# Patient Record
Sex: Male | Born: 1985 | Race: Black or African American | Hispanic: No | Marital: Single | Smoking: Current every day smoker
Health system: Southern US, Community
[De-identification: ages and names within clinical notes are randomized; demographics above are authoritative.]

---

## 2007-03-07 ENCOUNTER — Emergency Department: Payer: Self-pay | Admitting: Emergency Medicine

## 2010-02-10 ENCOUNTER — Emergency Department: Payer: Self-pay | Admitting: Emergency Medicine

## 2012-01-24 ENCOUNTER — Emergency Department: Payer: Self-pay | Admitting: Emergency Medicine

## 2012-08-11 ENCOUNTER — Emergency Department: Payer: Self-pay | Admitting: Internal Medicine

## 2013-07-10 ENCOUNTER — Emergency Department (HOSPITAL_COMMUNITY)
Admission: EM | Admit: 2013-07-10 | Discharge: 2013-07-10 | Disposition: A | Discharge status: Home / Self Care | Payer: Self-pay | Attending: Emergency Medicine | Admitting: Emergency Medicine

## 2013-07-10 ENCOUNTER — Emergency Department (HOSPITAL_COMMUNITY): Payer: Self-pay

## 2013-07-10 ENCOUNTER — Encounter (HOSPITAL_COMMUNITY): Payer: Self-pay | Admitting: Emergency Medicine

## 2013-07-10 DIAGNOSIS — N419 Inflammatory disease of prostate, unspecified: Secondary | ICD-10-CM | POA: Insufficient documentation

## 2013-07-10 DIAGNOSIS — Z87891 Personal history of nicotine dependence: Secondary | ICD-10-CM | POA: Insufficient documentation

## 2013-07-10 LAB — CBC WITH DIFFERENTIAL/PLATELET
BASOS ABS: 0 10*3/uL (ref 0.0–0.1)
Basophils Relative: 0 % (ref 0–1)
Eosinophils Absolute: 0.2 10*3/uL (ref 0.0–0.7)
Eosinophils Relative: 2 % (ref 0–5)
HCT: 40 % (ref 39.0–52.0)
Hemoglobin: 13.8 g/dL (ref 13.0–17.0)
LYMPHS ABS: 2.4 10*3/uL (ref 0.7–4.0)
LYMPHS PCT: 17 % (ref 12–46)
MCH: 32.3 pg (ref 26.0–34.0)
MCHC: 34.5 g/dL (ref 30.0–36.0)
MCV: 93.7 fL (ref 78.0–100.0)
MONO ABS: 0.8 10*3/uL (ref 0.1–1.0)
Monocytes Relative: 6 % (ref 3–12)
NEUTROS ABS: 10.7 10*3/uL — AB (ref 1.7–7.7)
Neutrophils Relative %: 76 % (ref 43–77)
Platelets: 216 10*3/uL (ref 150–400)
RBC: 4.27 MIL/uL (ref 4.22–5.81)
RDW: 13.4 % (ref 11.5–15.5)
WBC: 14.1 10*3/uL — AB (ref 4.0–10.5)

## 2013-07-10 LAB — COMPREHENSIVE METABOLIC PANEL
ALT: 11 U/L (ref 0–53)
AST: 16 U/L (ref 0–37)
Albumin: 4.2 g/dL (ref 3.5–5.2)
Alkaline Phosphatase: 72 U/L (ref 39–117)
BILIRUBIN TOTAL: 0.4 mg/dL (ref 0.3–1.2)
BUN: 10 mg/dL (ref 6–23)
CHLORIDE: 98 meq/L (ref 96–112)
CO2: 27 meq/L (ref 19–32)
Calcium: 10 mg/dL (ref 8.4–10.5)
Creatinine, Ser: 0.9 mg/dL (ref 0.50–1.35)
GLUCOSE: 87 mg/dL (ref 70–99)
Potassium: 4.1 mEq/L (ref 3.7–5.3)
SODIUM: 137 meq/L (ref 137–147)
Total Protein: 7.9 g/dL (ref 6.0–8.3)

## 2013-07-10 LAB — LIPASE, BLOOD: Lipase: 12 U/L (ref 11–59)

## 2013-07-10 LAB — URINALYSIS, ROUTINE W REFLEX MICROSCOPIC
Bilirubin Urine: NEGATIVE
GLUCOSE, UA: NEGATIVE mg/dL
KETONES UR: NEGATIVE mg/dL
Nitrite: NEGATIVE
PROTEIN: 30 mg/dL — AB
Specific Gravity, Urine: 1.016 (ref 1.005–1.030)
Urobilinogen, UA: 1 mg/dL (ref 0.0–1.0)
pH: 6.5 (ref 5.0–8.0)

## 2013-07-10 LAB — URINE MICROSCOPIC-ADD ON

## 2013-07-10 MED ORDER — SODIUM CHLORIDE 0.9 % IV SOLN
1000.0000 mL | INTRAVENOUS | Status: DC
  Administered 2013-07-10: 1000 mL via INTRAVENOUS

## 2013-07-10 MED ORDER — NAPROXEN 500 MG PO TABS: 500.0000 mg | ORAL_TABLET | Freq: Two times a day (BID) | ORAL | Status: DC

## 2013-07-10 MED ORDER — SODIUM CHLORIDE 0.9 % IV SOLN
1000.0000 mL | Freq: Once | INTRAVENOUS | Status: AC
  Administered 2013-07-10: 1000 mL via INTRAVENOUS

## 2013-07-10 MED ORDER — DOXYCYCLINE HYCLATE 100 MG PO CAPS: 100.0000 mg | ORAL_CAPSULE | Freq: Two times a day (BID) | ORAL | Status: AC

## 2013-07-10 MED ORDER — HYDROCODONE-ACETAMINOPHEN 5-325 MG PO TABS: 1.0000 | ORAL_TABLET | ORAL | Status: AC | PRN

## 2013-07-10 MED ORDER — HYDROMORPHONE HCL PF 1 MG/ML IJ SOLN
0.5000 mg | INTRAMUSCULAR | Status: DC | PRN
  Administered 2013-07-10: 0.5 mg via INTRAVENOUS
  Filled 2013-07-10: qty 1

## 2013-07-10 MED ORDER — ONDANSETRON HCL 4 MG/2ML IJ SOLN
4.0000 mg | Freq: Once | INTRAMUSCULAR | Status: AC
  Administered 2013-07-10: 4 mg via INTRAVENOUS
  Filled 2013-07-10: qty 2

## 2013-07-10 MED ORDER — DEXTROSE 5 % IV SOLN
1.0000 g | INTRAVENOUS | Status: DC
  Administered 2013-07-10: 1 g via INTRAVENOUS
  Filled 2013-07-10: qty 10

## 2013-07-10 NOTE — Discharge Instructions (Signed)
Prostatitis The prostate gland is about the size and shape of a walnut. It is located just below your bladder. It produces one of the components of semen, which is made up of sperm and the fluids that help nourish and transport it out from the testicles. Prostatitis is inflammation of the prostate gland.  There are four types of prostatitis:  Acute bacterial prostatitis This is the least common type of prostatitis. It starts quickly and usually is associated with a bladder infection, high fever, and shaking chills. It can occur at any age.  Chronic bacterial prostatitis This is a persistent bacterial infection in the prostate. It usually develops from repeated acute bacterial prostatitis or acute bacterial prostatitis that was not properly treated. It can occur in men of any age but is most common in middle-aged men whose prostate has begun to enlarge. The symptoms are not as severe as those in acute bacterial prostatitis. Discomfort in the part of your body that is in front of your rectum and below your scrotum (perineum), lower abdomen, or in the head of your penis (glans) may represent your primary discomfort.  Chronic prostatitis (nonbacterial) This is the most common type of prostatitis. It is inflammation of the prostate gland that is not caused by a bacterial infection. The cause is unknown and may be associated with a viral infection or autoimmune disorder.  Prostatodynia (pelvic floor disorder) This is associated with increased muscular tone in the pelvis surrounding the prostate. CAUSES The causes of bacterial prostatitis are bacterial infection. The causes of the other types of prostatitis are unknown.  SYMPTOMS  Symptoms can vary depending upon the type of prostatitis that exists. There can also be overlap in symptoms. Possible symptoms for each type of prostatitis are listed below. Acute Bacterial Prostatitis  Painful urination.  Fever or chills.  Muscle or joint pains.  Low  back pain.  Low abdominal pain.  Inability to empty bladder completely. Chronic Bacterial Prostatitis, Chronic Nonbacterial Prostatitis, and Prostatodynia  Sudden urge to urinate.  Frequent urination.  Difficulty starting urine stream.  Weak urine stream.  Discharge from the urethra.  Dribbling after urination.  Rectal pain.  Pain in the testicles, penis, or tip of the penis.  Pain in the perineum.  Problems with sexual function.  Painful ejaculation.  Bloody semen. DIAGNOSIS  In order to diagnose prostatitis, your health care provider will ask about your symptoms. One or more urine samples will be taken and tested (urinalysis). If the urinalysis result is negative for bacteria, your health care provider may use a finger to feel your prostate (digital rectal exam). This exam helps your health care provider determine if your prostate is swollen and tender. It will also produce a specimen of semen that can be analyzed. TREATMENT  Treatment for prostatitis depends on the cause. If a bacterial infection is the cause, it can be treated with antibiotic medicine. In cases of chronic bacterial prostatitis, the use of antibiotics for up to 1 month or 6 weeks may be necessary. Your health care provider may instruct you to take sitz baths to help relieve pain. A sitz bath is a bath of hot water in which your hips and buttocks are under water. This relaxes the pelvic floor muscles and often helps to relieve the pressure on your prostate. HOME CARE INSTRUCTIONS   Take all medicines as directed by your health care provider.  Take sitz baths as directed by your health care provider. SEEK MEDICAL CARE IF:   Your symptoms   get worse, not better.  You have a fever. SEEK IMMEDIATE MEDICAL CARE IF:   You have chills.  You feel nauseous or vomit.  You feel lightheaded or faint.  You are unable to urinate.  You have blood or blood clots in your urine. Document Released: 03/28/2000  Document Revised: 01/19/2013 Document Reviewed: 10/18/2012 [employer] Patient Information 2014 [employer].  

## 2013-07-10 NOTE — ED Notes (Signed)
Patient states that he is having pain to his left lower abominal area and flank area with blood in his urine x 4 days

## 2013-07-10 NOTE — ED Provider Notes (Signed)
CSN: [id number]     Arrival date & time 07/10/13  1506 History   First MD Initiated Contact with Patient 07/10/13 1539     Chief Complaint  Patient presents with  . Flank Pain    HPI Comments: Left sided, gradually started having pain.  Patient is a 28 y.o. male presenting with flank pain. The history is provided by the patient.  Flank Pain The current episode started more than 2 days ago. The problem occurs constantly. The problem has been gradually worsening. Pertinent negatives include no chest pain, no abdominal pain and no shortness of breath. Nothing aggravates the symptoms. Nothing relieves the symptoms. He has tried nothing for the symptoms.  Pt also noticed blood in the urine for the last 4 days.  No fevers.  No vomiting.  No appetite problems.  No constipation.  History reviewed. No pertinent past medical history. History reviewed. No pertinent past surgical history. No family history on file. History  Substance Use Topics  . Smoking status: Former Games developer  . Smokeless tobacco: Not on file  . Alcohol Use: No    Review of Systems  Respiratory: Negative for shortness of breath.   Cardiovascular: Negative for chest pain.  Gastrointestinal: Negative for abdominal pain.  Genitourinary: Positive for flank pain.  All other systems reviewed and are negative.      Allergies  Review of patient's allergies indicates no known allergies.  Home Medications   Current Outpatient Rx  Name  Route  Sig  Dispense  Refill  . acetaminophen (TYLENOL) 500 MG tablet   Oral   Take 500 mg by mouth every 6 (six) hours as needed for moderate pain.         Marland Kitchen aspirin-acetaminophen-caffeine (EXCEDRIN MIGRAINE) 250-250-65 MG per tablet   Oral   Take 1 tablet by mouth every 6 (six) hours as needed for headache.          BP 116/70  Pulse 96  Temp(Src) 99.2 F (37.3 C) (Oral)  Resp 14  Ht 6' (1.829 m)  Wt 183 lb (83.008 kg)  BMI 24.81 kg/m2  SpO2 99% Physical Exam  Nursing note  and vitals reviewed. Constitutional: He appears well-developed and well-nourished. No distress.  HENT:  Head: Normocephalic and atraumatic.  Right Ear: External ear normal.  Left Ear: External ear normal.  Eyes: Conjunctivae are normal. Right eye exhibits no discharge. Left eye exhibits no discharge. No scleral icterus.  Neck: Neck supple. No tracheal deviation present.  Cardiovascular: Normal rate, regular rhythm and intact distal pulses.   Pulmonary/Chest: Effort normal and breath sounds normal. No stridor. No respiratory distress. He has no wheezes. He has no rales.  Abdominal: Soft. Bowel sounds are normal. He exhibits no distension. There is no tenderness. There is no rebound and no guarding. No hernia.  Musculoskeletal: He exhibits no edema and no tenderness.  Neurological: He is alert. He has normal strength. No cranial nerve deficit (no facial droop, extraocular movements intact, no slurred speech) or sensory deficit. He exhibits normal muscle tone. He displays no seizure activity. Coordination normal.  Skin: Skin is warm and dry. No rash noted.  Psychiatric: He has a normal mood and affect.    ED Course  Procedures (including critical care time) Labs Review Labs Reviewed  CBC WITH DIFFERENTIAL - Abnormal; Notable for the following:    WBC 14.1 (*)    Neutro Abs 10.7 (*)    All other components within normal limits  URINALYSIS, ROUTINE W REFLEX MICROSCOPIC - Abnormal;  Notable for the following:    APPearance CLOUDY (*)    Hgb urine dipstick LARGE (*)    Protein, ur 30 (*)    Leukocytes, UA LARGE (*)    All other components within normal limits  COMPREHENSIVE METABOLIC PANEL  LIPASE, BLOOD  URINE MICROSCOPIC-ADD ON   Imaging Review Ct Abdomen Pelvis Wo Contrast  07/10/2013   CLINICAL DATA:  Left flank pain.  Dysuria.  Kidney stone.  EXAM: CT ABDOMEN AND PELVIS WITHOUT CONTRAST  TECHNIQUE: Multidetector CT imaging of the abdomen and pelvis was performed following the  standard protocol without intravenous contrast.  COMPARISON:  None.  FINDINGS: Lung Bases: Dependent atelectasis.  Liver: Unenhanced CT was performed per clinician order. Lack of IV contrast limits sensitivity and specificity, especially for evaluation of abdominal/pelvic solid viscera. Normal.  Spleen:  Normal.  Gallbladder:  Biliary sludge.  Common bile duct:  Normal.  Pancreas:  Normal.  Adrenal glands:  Normal.  Kidneys: No renal calculi. No hydronephrosis. Ureters appear within normal limits.  Stomach:  Normal.  Small bowel:  Normal.  Colon:   Normal appendix.  The colon appears normal.  Pelvic Genitourinary: Clarita LeberStranding is present in the anatomic pelvis centered around the prostate gland and seminal vesicles. The findings suggest prostatitis. Urinary bladder appears within normal limits. Prostate measures 5.6 cm transverse. Small probably reactive pelvic lymph nodes are present anterior to the sacrum in the common iliac nodal chains.  Bones:  No aggressive osseous lesions.  L5-S1 disc protrusion.  Vasculature: Grossly normal.  Body Wall: Fat containing periumbilical hernia.  IMPRESSION: Negative for urolithiasis. Stranding in the anatomic pelvis centered around the prostate gland and seminal vesicles. This is suspicious for prostatitis.   Electronically Signed   By: Andreas NewportGeoffrey  Lamke M.D.   On: 07/10/2013 17:46    Medications  0.9 %  sodium chloride infusion (1,000 mLs Intravenous New Bag/Given 07/10/13 1622)    Followed by  0.9 %  sodium chloride infusion (not administered)  HYDROmorphone (DILAUDID) injection 0.5 mg (0.5 mg Intravenous Given 07/10/13 1636)  cefTRIAXone (ROCEPHIN) 1 g in dextrose 5 % 50 mL IVPB (not administered)  ondansetron (ZOFRAN) injection 4 mg (4 mg Intravenous Given 07/10/13 1622)    MDM   Final diagnoses:  Prostatitis    Ct suggests prostatitis.  Symptoms and labs correlate as well.  Will give dose of rocephin and rx doxycycline.  Follow up with PCP    Celene KrasJon R Tj Kitchings,  MD 07/10/13 [phone]

## 2015-04-17 IMAGING — CT CT ABD-PELV W/O CM
1 series · 15 of 23 positions shown, 19 images · non-contrast
Comparison: None.

CLINICAL DATA: Left flank pain.  Dysuria.  Kidney stone.

EXAM:
CT ABDOMEN AND PELVIS WITHOUT CONTRAST
TECHNIQUE: Multidetector CT imaging of the abdomen and pelvis was performed
following the standard protocol without intravenous contrast.

[Series 4: lung · axial · 0.74mm/px · z∈[-166,-66]mm · 15 of 23 slices shown, 19 images]
[im 2/23  soft-tissue]
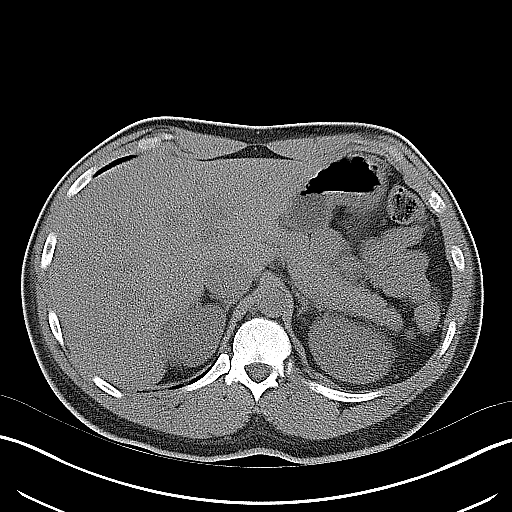
[im 2/23  bone]
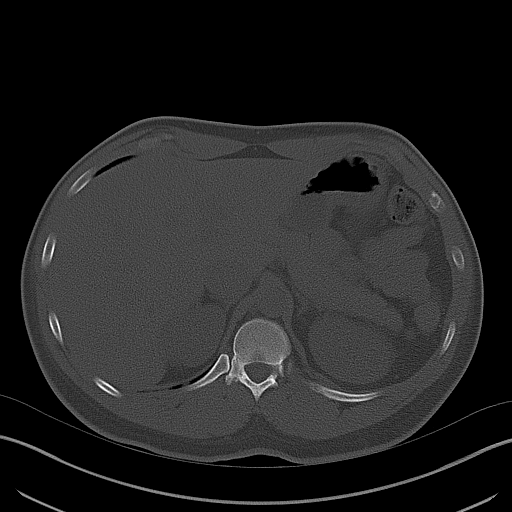
[im 4/23  soft-tissue]
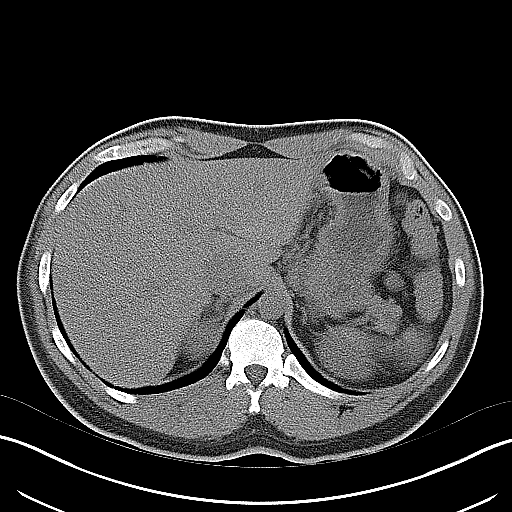
[im 6/23  soft-tissue]
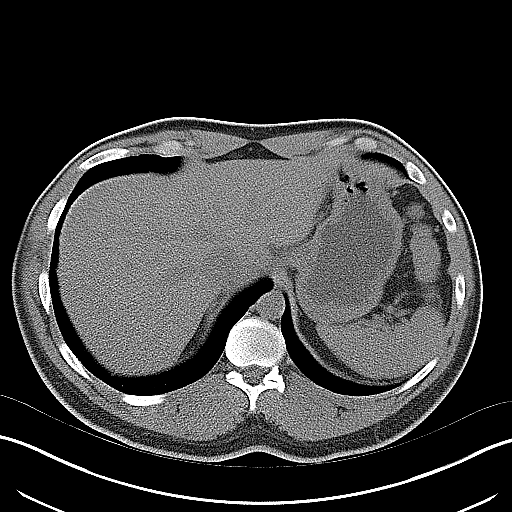
[im 7/23  soft-tissue]
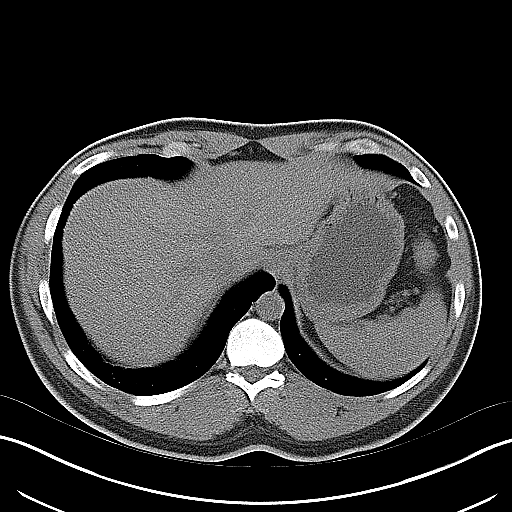
[im 9/23  soft-tissue]
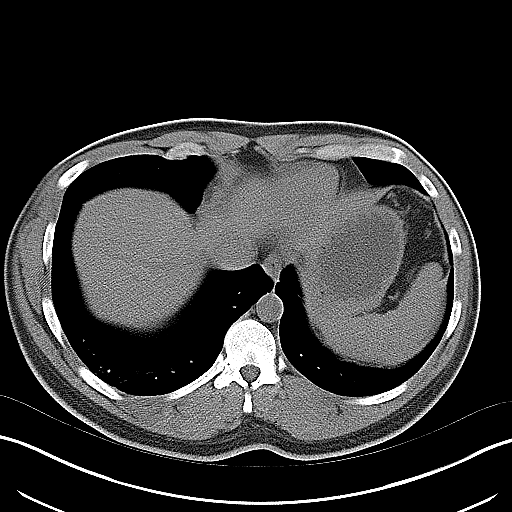
[im 10/23  soft-tissue]
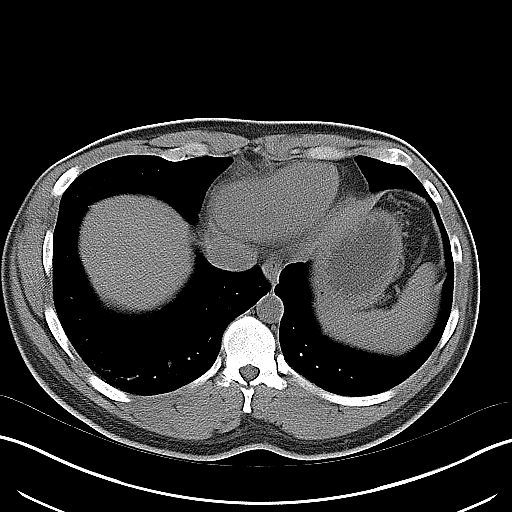
[im 12/23  soft-tissue]
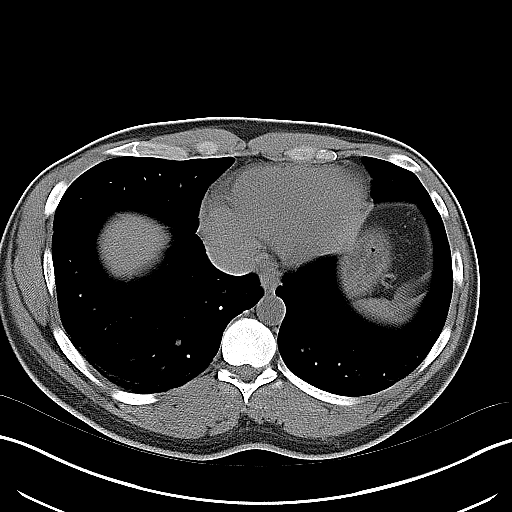
[im 14/23  soft-tissue]
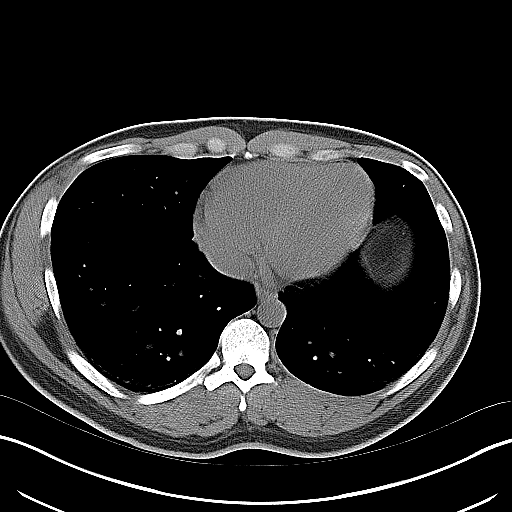
[im 15/23  soft-tissue]
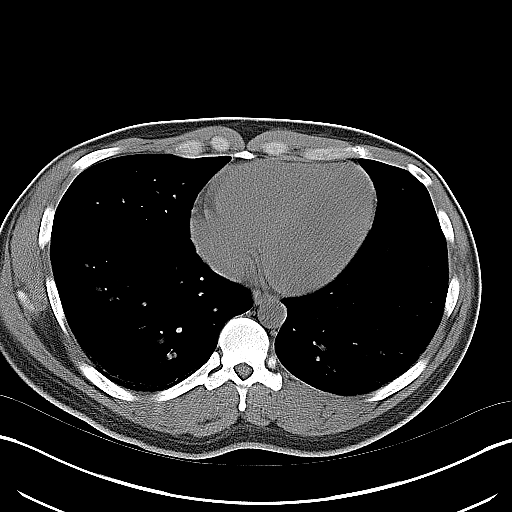
[im 15/23  bone]
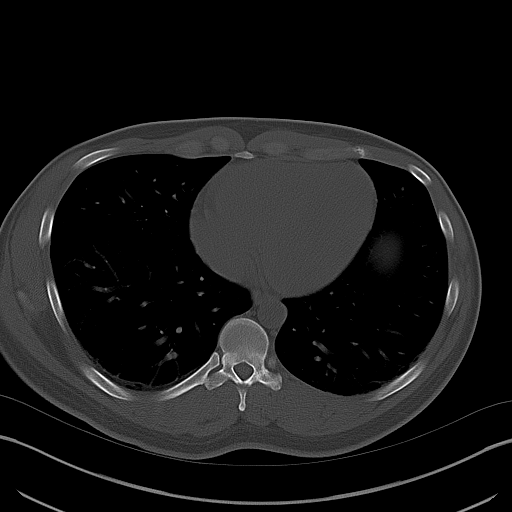
[im 17/23  soft-tissue]
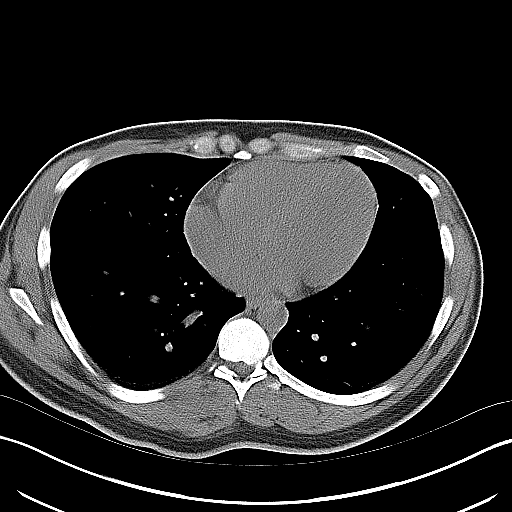
[im 18/23  soft-tissue]
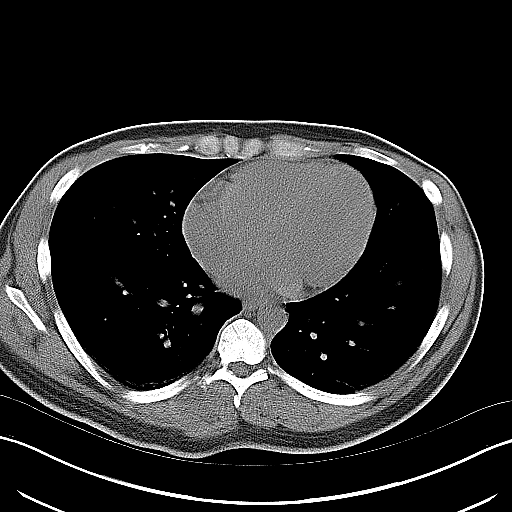
[im 19/23  lung]
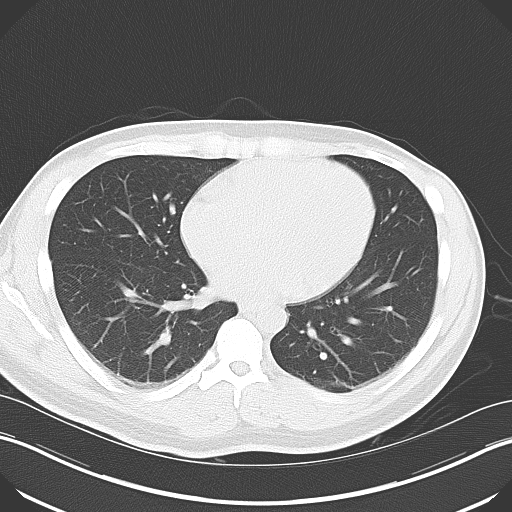
[im 20/23  soft-tissue]
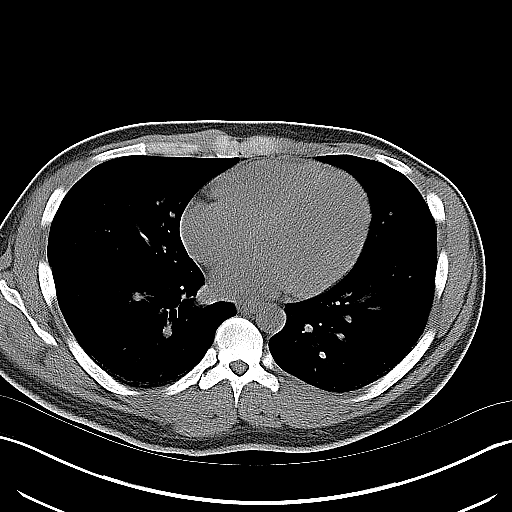
[im 20/23  lung]
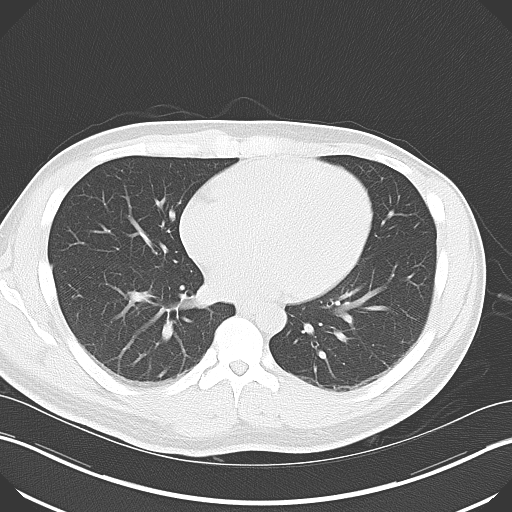
[im 21/23  lung]
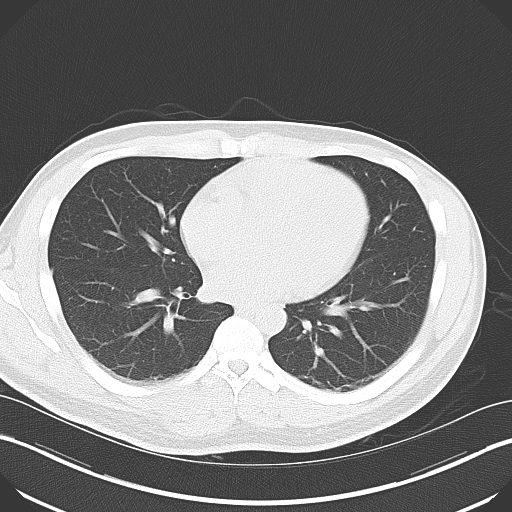
[im 22/23  soft-tissue]
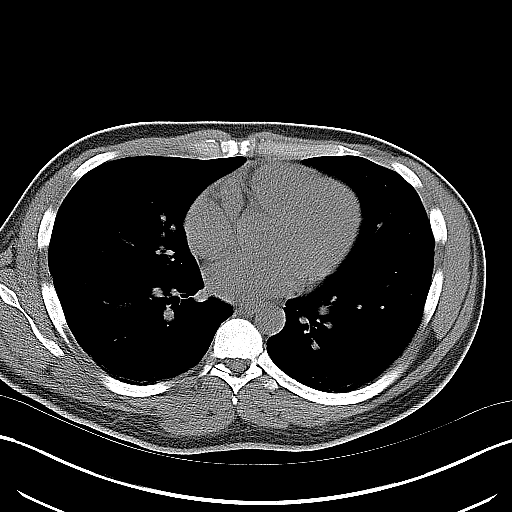
[im 22/23  lung]
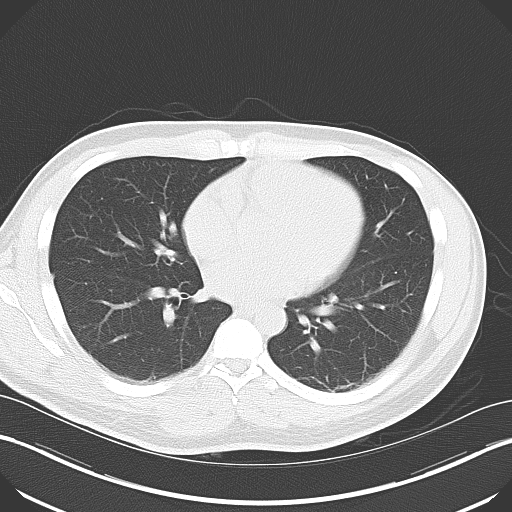

[15 of 23 positions shown; findings below may reference images not displayed]

FINDINGS: Lung Bases: Dependent atelectasis.

Liver: Unenhanced CT was performed per clinician order. Lack of IV
contrast limits sensitivity and specificity, especially for
evaluation of abdominal/pelvic solid viscera. Normal.

Spleen:  Normal.

Gallbladder:  Biliary sludge.

Common bile duct:  Normal.

Pancreas:  Normal.

Adrenal glands:  Normal.

Kidneys: No renal calculi. No hydronephrosis. Ureters appear within
normal limits.

Stomach:  Normal.

Small bowel:  Normal.

Colon:   Normal appendix.  The colon appears normal.

Pelvic Genitourinary: Stranding is present in the anatomic pelvis
centered around the prostate gland and seminal vesicles. The
findings suggest prostatitis. Urinary bladder appears within normal
limits. Prostate measures 5.6 cm transverse. Small probably reactive
pelvic lymph nodes are present anterior to the sacrum in the common
iliac nodal chains.

Bones:  No aggressive osseous lesions.  L5-S1 disc protrusion.

Vasculature: Grossly normal.

Body Wall: Fat containing periumbilical hernia.
IMPRESSION: Negative for urolithiasis. Stranding in the anatomic pelvis centered
around the prostate gland and seminal vesicles. This is suspicious
for prostatitis.

## 2017-10-15 ENCOUNTER — Encounter: Payer: Self-pay | Admitting: Emergency Medicine

## 2017-10-15 ENCOUNTER — Emergency Department
Admission: EM | Admit: 2017-10-15 | Discharge: 2017-10-15 | Disposition: A | Discharge status: Home / Self Care | Payer: PRIVATE HEALTH INSURANCE | Attending: Emergency Medicine | Admitting: Emergency Medicine

## 2017-10-15 ENCOUNTER — Other Ambulatory Visit: Payer: Self-pay

## 2017-10-15 ENCOUNTER — Emergency Department: Payer: PRIVATE HEALTH INSURANCE

## 2017-10-15 DIAGNOSIS — Z79899 Other long term (current) drug therapy: Secondary | ICD-10-CM | POA: Insufficient documentation

## 2017-10-15 DIAGNOSIS — Y998 Other external cause status: Secondary | ICD-10-CM | POA: Insufficient documentation

## 2017-10-15 DIAGNOSIS — Y9389 Activity, other specified: Secondary | ICD-10-CM | POA: Insufficient documentation

## 2017-10-15 DIAGNOSIS — Y92411 Interstate highway as the place of occurrence of the external cause: Secondary | ICD-10-CM | POA: Insufficient documentation

## 2017-10-15 DIAGNOSIS — F17200 Nicotine dependence, unspecified, uncomplicated: Secondary | ICD-10-CM | POA: Diagnosis not present

## 2017-10-15 DIAGNOSIS — S6991XA Unspecified injury of right wrist, hand and finger(s), initial encounter: Secondary | ICD-10-CM | POA: Diagnosis present

## 2017-10-15 DIAGNOSIS — S66911A Strain of unspecified muscle, fascia and tendon at wrist and hand level, right hand, initial encounter: Secondary | ICD-10-CM | POA: Diagnosis not present

## 2017-10-15 MED ORDER — NAPROXEN 500 MG PO TABS: 500.0000 mg | ORAL_TABLET | Freq: Two times a day (BID) | ORAL | 0 refills | Status: AC

## 2017-10-15 MED ORDER — CYCLOBENZAPRINE HCL 10 MG PO TABS: 10.0000 mg | ORAL_TABLET | Freq: Three times a day (TID) | ORAL | 0 refills | Status: AC | PRN
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
## 2017-10-20 ENCOUNTER — Emergency Department
Admission: EM | Admit: 2017-10-20 | Discharge: 2017-10-20 | Disposition: A | Discharge status: Home / Self Care | Payer: Managed Care, Other (non HMO) | Attending: Emergency Medicine | Admitting: Emergency Medicine

## 2017-10-20 ENCOUNTER — Encounter: Payer: Self-pay | Admitting: *Deleted

## 2017-10-20 ENCOUNTER — Other Ambulatory Visit: Payer: Self-pay

## 2017-10-20 DIAGNOSIS — R220 Localized swelling, mass and lump, head: Secondary | ICD-10-CM | POA: Insufficient documentation

## 2017-10-20 DIAGNOSIS — Y92019 Unspecified place in single-family (private) house as the place of occurrence of the external cause: Secondary | ICD-10-CM | POA: Insufficient documentation

## 2017-10-20 DIAGNOSIS — T656X1A Toxic effect of paints and dyes, not elsewhere classified, accidental (unintentional), initial encounter: Secondary | ICD-10-CM | POA: Insufficient documentation

## 2017-10-20 DIAGNOSIS — T304 Corrosion of unspecified body region, unspecified degree: Secondary | ICD-10-CM

## 2017-10-20 DIAGNOSIS — T7840XA Allergy, unspecified, initial encounter: Secondary | ICD-10-CM

## 2017-10-20 DIAGNOSIS — F17200 Nicotine dependence, unspecified, uncomplicated: Secondary | ICD-10-CM | POA: Insufficient documentation

## 2017-10-20 DIAGNOSIS — Y93E8 Activity, other personal hygiene: Secondary | ICD-10-CM | POA: Insufficient documentation

## 2017-10-20 DIAGNOSIS — Y998 Other external cause status: Secondary | ICD-10-CM | POA: Insufficient documentation

## 2017-10-20 DIAGNOSIS — X58XXXA Exposure to other specified factors, initial encounter: Secondary | ICD-10-CM | POA: Insufficient documentation

## 2017-10-20 DIAGNOSIS — T2045XA Corrosion of unspecified degree of scalp [any part], initial encounter: Secondary | ICD-10-CM | POA: Insufficient documentation

## 2017-10-20 MED ORDER — CEPHALEXIN 500 MG PO CAPS
ORAL_CAPSULE | ORAL | Status: AC
  Filled 2017-10-20: qty 1

## 2017-10-20 MED ORDER — PREDNISONE 20 MG PO TABS
50.0000 mg | ORAL_TABLET | Freq: Once | ORAL | Status: AC
  Administered 2017-10-20: 50 mg via ORAL

## 2017-10-20 MED ORDER — PREDNISONE 20 MG PO TABS
ORAL_TABLET | ORAL | Status: AC
  Filled 2017-10-20: qty 3

## 2017-10-20 MED ORDER — CEPHALEXIN 500 MG PO CAPS
500.0000 mg | ORAL_CAPSULE | Freq: Once | ORAL | Status: AC
  Administered 2017-10-20: 500 mg via ORAL

## 2017-10-20 MED ORDER — PREDNISONE 20 MG PO TABS: 60.0000 mg | ORAL_TABLET | Freq: Once | ORAL | Status: DC

## 2017-10-20 MED ORDER — PREDNISONE 20 MG PO TABS: 40.0000 mg | ORAL_TABLET | Freq: Every day | ORAL | 0 refills | Status: AC

## 2017-10-20 MED ORDER — CEPHALEXIN 500 MG PO CAPS: 500.0000 mg | ORAL_CAPSULE | Freq: Four times a day (QID) | ORAL | 0 refills | Status: AC

## 2017-10-20 MED ORDER — BACITRACIN ZINC 500 UNIT/GM EX OINT: TOPICAL_OINTMENT | CUTANEOUS | 0 refills | Status: AC

## 2017-10-20 NOTE — ED Notes (Signed)
First Nurse Note: Pts left eye is swollen, he states that he is having an allergic reaction to something topical. It started yesterday, has gotten worse. Denies any trouble breathing.

## 2017-10-20 NOTE — ED Triage Notes (Signed)
Pt to ED reporting bilateral eye swelling for the past two days. Pt noted a rash on his forehead two days ago that has progressed into significant swelling around right and left eyes. No changes in vision and no swelling reported in throat, neck or airway. Pt able to talk in complete sentences. No increased WOB or SOB. No known allergies. Pt reports having taken benadryl at home. Last does was 11:00 this morning.

## 2017-10-20 NOTE — ED Notes (Signed)
Swelling has decreased and pt reports feeling much better. Pts eyes are no longer red. Pt in NAD
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
